# Patient Record
Sex: Male | Born: 1955 | Race: Black or African American | Hispanic: No | Smoking: Never smoker
Health system: Southern US, Community
[De-identification: ages and names within clinical notes are randomized; demographics above are authoritative.]

## PROBLEM LIST (undated history)

## (undated) DIAGNOSIS — J439 Emphysema, unspecified: Secondary | ICD-10-CM

## (undated) HISTORY — PX: BACK SURGERY: SHX140

## (undated) HISTORY — PX: FOOT SURGERY: SHX648

---

## 2016-08-15 ENCOUNTER — Encounter (HOSPITAL_COMMUNITY): Payer: Self-pay | Admitting: *Deleted

## 2016-08-15 ENCOUNTER — Emergency Department (HOSPITAL_COMMUNITY)
Admission: EM | Admit: 2016-08-15 | Discharge: 2016-08-15 | Disposition: A | Discharge status: Home / Self Care | Payer: Self-pay | Attending: Emergency Medicine | Admitting: Emergency Medicine

## 2016-08-15 DIAGNOSIS — Y929 Unspecified place or not applicable: Secondary | ICD-10-CM | POA: Insufficient documentation

## 2016-08-15 DIAGNOSIS — S60562A Insect bite (nonvenomous) of left hand, initial encounter: Secondary | ICD-10-CM | POA: Insufficient documentation

## 2016-08-15 DIAGNOSIS — Y939 Activity, unspecified: Secondary | ICD-10-CM | POA: Insufficient documentation

## 2016-08-15 DIAGNOSIS — S60561A Insect bite (nonvenomous) of right hand, initial encounter: Secondary | ICD-10-CM | POA: Insufficient documentation

## 2016-08-15 DIAGNOSIS — W57XXXA Bitten or stung by nonvenomous insect and other nonvenomous arthropods, initial encounter: Secondary | ICD-10-CM | POA: Insufficient documentation

## 2016-08-15 DIAGNOSIS — Y999 Unspecified external cause status: Secondary | ICD-10-CM | POA: Insufficient documentation

## 2016-08-15 HISTORY — DX: Emphysema, unspecified: J43.9

## 2016-08-15 MED ORDER — HYDROXYZINE HCL 25 MG PO TABS: 25.0000 mg | ORAL_TABLET | Freq: Three times a day (TID) | ORAL | 0 refills | Status: AC | PRN

## 2016-08-15 MED ORDER — PERMETHRIN 5 % EX CREA: TOPICAL_CREAM | CUTANEOUS | 0 refills | Status: AC

## 2016-08-15 NOTE — Discharge Instructions (Signed)
It was my pleasure taking care of you today!  Atarax as needed for itching. Apply Permethrin as directed. Be sure the cream is on the skin for at least 8 hours.  Most people sleep in it over night & wash it off in the morning. You may repeat treatment in 1 week if there is no improvement. You may also use camomile lotion if needed - try not to scratch! Please follow-up with your primary doctor in 3-5 days for discussion of your diagnoses and further evaluation after today's visit; Please return to the ER for new or worsening symptoms, signs of infection or any additional concerns.   Wash all clothing & linens in hot water. Spray all upholstered surfaces with Nix spray.  Treat all family members with the prescribed cream.

## 2016-08-15 NOTE — ED Triage Notes (Signed)
Patient is alert and oriented x4.  He is being seen this morning for multiple bug bites all over his body.  Currently he describes his pain as 5 of 10 discomfort.

## 2016-08-15 NOTE — ED Provider Notes (Signed)
[facility]-EMERGENCY DEPT Provider Note   CSN: [id number] Arrival date & time: 08/15/16  0411     History   Chief Complaint Chief Complaint  Patient presents with  . Insect Bite    HPI Kenneth [facility] is a 61 y.o. male.  The history is provided by the patient and medical records. No language interpreter was used.   Kenneth [facility] is a 61 y.o. male who presents to the Emergency Department complaining of generalized itching and tiny bug bites all over entire body x 3-4 days. Roommate with similar bites. [facility] type of topical cream to put all over but he doesn't know name of cream. No other meds taken. Itching worse at night. No alleviating factors noted. Has put all of his sheets and clothes in plastic bags. He was told maintaince guy at his housing complex would be coming by to exterminate but has not done so yet.   Past Medical History:  Diagnosis Date  . Emphysema lung (HCC)     There are no active problems to display for this patient.   Past Surgical History:  Procedure Laterality Date  . BACK SURGERY    . FOOT SURGERY     left foot       Home Medications    Prior to Admission medications   Not on File    Family History History reviewed. No pertinent family history.  Social History Social History  Substance Use Topics  . Smoking status: Never Smoker  . Smokeless tobacco: Never Used  . Alcohol use No     Allergies   Patient has no known allergies.   Review of Systems Review of Systems  Constitutional: Negative for chills and fever.  Musculoskeletal: Negative for arthralgias and myalgias.  Skin: Positive for rash.     Physical Exam Updated Vital Signs BP 124/88 (BP Location: Left Arm)   Pulse 70   Temp 97.9 F (36.6 C) (Oral)   Resp 18   Ht 6' (1.829 m)   Wt 68 kg (150 lb)   SpO2 100%   BMI 20.34 kg/m   Physical Exam  Constitutional: He is oriented to person, place, and time. He appears well-developed and  well-nourished. No distress.  HENT:  Head: Normocephalic and atraumatic.  Cardiovascular: Normal rate, regular rhythm and normal heart sounds.   No murmur heard. Pulmonary/Chest: Effort normal and breath sounds normal. No respiratory distress.  Abdominal: Soft. He exhibits no distension. There is no tenderness.  Musculoskeletal: Normal range of motion.  Neurological: He is alert and oriented to person, place, and time.  Skin: Skin is warm and dry.  No visible rash seen. Excoriation marks to upper extremities.  Nursing note and vitals reviewed.    ED Treatments / Results  Labs (all labs ordered are listed, but only abnormal results are displayed) Labs Reviewed - No data to display  EKG  EKG Interpretation None       Radiology No results found.  Procedures Procedures (including critical care time)  Medications Ordered in ED Medications - No data to display   Initial Impression / Assessment and Plan / ED Course  I have reviewed the triage vital signs and the nursing notes.  Pertinent labs & imaging results that were available during my care of the patient were reviewed by me and considered in my medical decision making (see chart for details).    [name] is a 61 y.o. male who presents to ED for pruritic rash c/w  bed bugs. Roommate with similar. No fevers or other systemic symptoms. No signs of superimposed infection. Advised on proper cleaning of household, clothing and bedding for treatment of infestation. Encouraged to call exterminator. Symptomatic home care discussed. All questions answered.   Final Clinical Impressions(s) / ED Diagnoses   Final diagnoses:  None    New Prescriptions New Prescriptions   No medications on file     Ward, Chase PicketJaime Pilcher, PA-C 08/15/16 [id number]    Palumbo, April, MD 08/15/16 [id number]

## 2018-06-19 ENCOUNTER — Emergency Department (HOSPITAL_COMMUNITY)
Admission: EM | Admit: 2018-06-19 | Discharge: 2018-06-19 | Disposition: A | Discharge status: Other Acute Inpatient Hospital | Payer: No Typology Code available for payment source | Attending: Emergency Medicine | Admitting: Emergency Medicine

## 2018-06-19 ENCOUNTER — Other Ambulatory Visit: Payer: Self-pay

## 2018-06-19 ENCOUNTER — Encounter (HOSPITAL_COMMUNITY): Payer: Self-pay

## 2018-06-19 ENCOUNTER — Emergency Department (HOSPITAL_COMMUNITY): Payer: No Typology Code available for payment source

## 2018-06-19 DIAGNOSIS — T85590A Other mechanical complication of bile duct prosthesis, initial encounter: Secondary | ICD-10-CM | POA: Diagnosis not present

## 2018-06-19 DIAGNOSIS — Y732 Prosthetic and other implants, materials and accessory gastroenterology and urology devices associated with adverse incidents: Secondary | ICD-10-CM | POA: Diagnosis not present

## 2018-06-19 DIAGNOSIS — K859 Acute pancreatitis without necrosis or infection, unspecified: Secondary | ICD-10-CM | POA: Insufficient documentation

## 2018-06-19 DIAGNOSIS — K9181 Other intraoperative complications of digestive system: Secondary | ICD-10-CM

## 2018-06-19 DIAGNOSIS — R1084 Generalized abdominal pain: Secondary | ICD-10-CM | POA: Diagnosis present

## 2018-06-19 DIAGNOSIS — K8689 Other specified diseases of pancreas: Secondary | ICD-10-CM | POA: Insufficient documentation

## 2018-06-19 DIAGNOSIS — J449 Chronic obstructive pulmonary disease, unspecified: Secondary | ICD-10-CM | POA: Insufficient documentation

## 2018-06-19 LAB — CBC WITH DIFFERENTIAL/PLATELET
Abs Immature Granulocytes: 0.07 10*3/uL (ref 0.00–0.07)
BASOS ABS: 0 10*3/uL (ref 0.0–0.1)
Basophils Relative: 0 %
EOS PCT: 0 %
Eosinophils Absolute: 0 10*3/uL (ref 0.0–0.5)
HCT: 43.8 % (ref 39.0–52.0)
Hemoglobin: 14.9 g/dL (ref 13.0–17.0)
Immature Granulocytes: 1 %
LYMPHS PCT: 10 %
Lymphs Abs: 1.3 10*3/uL (ref 0.7–4.0)
MCH: 33.9 pg (ref 26.0–34.0)
MCHC: 34 g/dL (ref 30.0–36.0)
MCV: 99.8 fL (ref 80.0–100.0)
MONO ABS: 0.8 10*3/uL (ref 0.1–1.0)
Monocytes Relative: 5 %
NRBC: 0 % (ref 0.0–0.2)
Neutro Abs: 11.9 10*3/uL — ABNORMAL HIGH (ref 1.7–7.7)
Neutrophils Relative %: 84 %
Platelets: 299 10*3/uL (ref 150–400)
RBC: 4.39 MIL/uL (ref 4.22–5.81)
RDW: 13.7 % (ref 11.5–15.5)
WBC: 14.1 10*3/uL — ABNORMAL HIGH (ref 4.0–10.5)

## 2018-06-19 LAB — COMPREHENSIVE METABOLIC PANEL
ALT: 17 U/L (ref 0–44)
ANION GAP: 9 (ref 5–15)
AST: 24 U/L (ref 15–41)
Albumin: 3.7 g/dL (ref 3.5–5.0)
Alkaline Phosphatase: 57 U/L (ref 38–126)
BILIRUBIN TOTAL: 1.1 mg/dL (ref 0.3–1.2)
BUN: 14 mg/dL (ref 8–23)
CALCIUM: 8.5 mg/dL — AB (ref 8.9–10.3)
CHLORIDE: 104 mmol/L (ref 98–111)
CO2: 24 mmol/L (ref 22–32)
Creatinine, Ser: 0.92 mg/dL (ref 0.61–1.24)
GFR calc non Af Amer: >60 mL/min (ref >60)
Glucose, Bld: 134 mg/dL — ABNORMAL HIGH (ref 70–99)
POTASSIUM: 3.8 mmol/L (ref 3.5–5.1)
Sodium: 137 mmol/L (ref 135–145)
TOTAL PROTEIN: 7.2 g/dL (ref 6.5–8.1)

## 2018-06-19 LAB — LIPASE, BLOOD: Lipase: 1081 U/L — ABNORMAL HIGH (ref 11–51)

## 2018-06-19 LAB — URINALYSIS, ROUTINE W REFLEX MICROSCOPIC
BILIRUBIN URINE: NEGATIVE
Glucose, UA: NEGATIVE mg/dL
Ketones, ur: NEGATIVE mg/dL
Leukocytes,Ua: NEGATIVE
NITRITE: NEGATIVE
Protein, ur: NEGATIVE mg/dL
Specific Gravity, Urine: 1.016 (ref 1.005–1.030)
pH: 5 (ref 5.0–8.0)

## 2018-06-19 LAB — ETHANOL: Alcohol, Ethyl (B): <10 mg/dL (ref <10)

## 2018-06-19 MED ORDER — HYDROMORPHONE HCL 1 MG/ML IJ SOLN
1.0000 mg | Freq: Once | INTRAMUSCULAR | Status: AC
  Administered 2018-06-19: 1 mg via INTRAVENOUS
  Filled 2018-06-19: qty 1

## 2018-06-19 MED ORDER — PIPERACILLIN-TAZOBACTAM 3.375 G IVPB 30 MIN
3.3750 g | Freq: Once | INTRAVENOUS | Status: AC
  Administered 2018-06-19: 3.375 g via INTRAVENOUS
  Filled 2018-06-19: qty 50

## 2018-06-19 MED ORDER — IOHEXOL 300 MG/ML  SOLN
100.0000 mL | Freq: Once | INTRAMUSCULAR | Status: AC | PRN
  Administered 2018-06-19: 100 mL via INTRAVENOUS

## 2018-06-19 MED ORDER — SODIUM CHLORIDE (PF) 0.9 % IJ SOLN
INTRAMUSCULAR | Status: AC
  Filled 2018-06-19: qty 50

## 2018-06-19 MED ORDER — MORPHINE SULFATE (PF) 4 MG/ML IV SOLN
4.0000 mg | Freq: Once | INTRAVENOUS | Status: AC
  Administered 2018-06-19: 4 mg via INTRAVENOUS
  Filled 2018-06-19: qty 1

## 2018-06-19 MED ORDER — ONDANSETRON HCL 4 MG/2ML IJ SOLN
4.0000 mg | Freq: Once | INTRAMUSCULAR | Status: AC
  Administered 2018-06-19: 4 mg via INTRAVENOUS
  Filled 2018-06-19: qty 2

## 2018-06-19 MED ORDER — ONDANSETRON HCL 4 MG/2ML IJ SOLN
INTRAMUSCULAR | Status: AC
  Administered 2018-06-19: 4 mg
  Filled 2018-06-19: qty 2

## 2018-06-19 MED ORDER — SODIUM CHLORIDE 0.9 % IV BOLUS
1000.0000 mL | Freq: Once | INTRAVENOUS | Status: AC
  Administered 2018-06-19: 1000 mL via INTRAVENOUS

## 2018-06-19 MED ORDER — MORPHINE SULFATE (PF) 2 MG/ML IV SOLN
2.0000 mg | Freq: Once | INTRAVENOUS | Status: AC
  Administered 2018-06-19: 2 mg via INTRAVENOUS
  Filled 2018-06-19: qty 1

## 2018-06-19 NOTE — ED Notes (Signed)
Report given to [facility]  

## 2018-06-19 NOTE — ED Notes (Signed)
Pt asked for more pain meds. Provider notified and will follow up.

## 2018-06-19 NOTE — ED Notes (Signed)
Pt in room. Meds given per MAR. Awaiting CT and lab results.

## 2018-06-19 NOTE — ED Triage Notes (Signed)
Pt had gallstones removed today and now he's in pain,he states they didn't give him anything for pain before he left Pt states he has also been vomiting

## 2018-06-19 NOTE — ED Notes (Signed)
Pt resting on side of bed. Reports mild relief from pain. Meds given per MAR. Awaiting provider and CT results. NAD

## 2018-06-19 NOTE — ED Notes (Signed)
[facility] for Dr USAA. PT go ED to ED(DR Katrinka Blazing)

## 2018-06-19 NOTE — ED Notes (Signed)
Meds given Per MAR. Awaiting transport. NAD

## 2018-06-19 NOTE — ED Provider Notes (Signed)
[facility] Provider Note   CSN: [id number] Arrival date & time: 06/19/18  0002    History   Chief Complaint Chief Complaint  Patient presents with   Abdominal Pain    HPI Torben Krawiec is a 63 y.o. male with PMHx COPD who presents to the ED complaining of sudden onset, constant,10/10, diffuse abdominal pain x 4 hours with nausea and vomiting. He states he had gallstones removed today at [facility] and when he went home he began having diffuse abdominal pain. He states he was prescribed pain medication but did not picked it up because his ride wouldn't take him to the pharmacy. Denies fever, chills, diarrhea, constipation, hematemesis, hematochezia, chest pain, SOB, or any other associated symptoms.        Past Medical History:  Diagnosis Date   Emphysema lung (HCC)     There are no active problems to display for this patient.   Past Surgical History:  Procedure Laterality Date   BACK SURGERY     FOOT SURGERY     left foot        Home Medications    Prior to Admission medications   Medication Sig Start Date End Date Taking? Authorizing Provider  albuterol (PROVENTIL HFA;VENTOLIN HFA) 108 (90 Base) MCG/ACT inhaler Inhale 1-2 puffs into the lungs every 6 (six) hours as needed for wheezing or shortness of breath.   Yes [provider]  hydrOXYzine (ATARAX/VISTARIL) 25 MG tablet Take 1 tablet (25 mg total) by mouth every 8 (eight) hours as needed for itching. Patient not taking: Reported on 06/19/2018 08/15/16   Ward, Chase Picket, PA-C  permethrin (ELIMITE) 5 % cream Apply to affected area once Patient not taking: Reported on 06/19/2018 08/15/16   Ward, Chase Picket, PA-C    Family History History reviewed. No pertinent family history.  Social History Social History   Tobacco Use   Smoking status: Never Smoker   Smokeless tobacco: Never Used  Substance Use Topics   Alcohol use: No   Drug use: No     Allergies     Patient has no known allergies.   Review of Systems Review of Systems  Constitutional: Negative for chills and fever.  HENT: Negative for ear pain and sore throat.   Eyes: Negative for visual disturbance.  Respiratory: Negative for cough and shortness of breath.   Cardiovascular: Negative for chest pain.  Gastrointestinal: Positive for abdominal pain, nausea and vomiting. Negative for blood in stool, constipation and diarrhea.  Genitourinary: Negative for discharge, dysuria, frequency and penile pain.  Musculoskeletal: Negative for back pain.  Skin: Negative for wound.  Neurological: Negative for headaches.     Physical Exam Updated Vital Signs BP 114/63    Pulse 77    Temp 98 F (36.7 C) (Oral)    Resp (!) 22    Ht 6' (1.829 m)    Wt 63.5 kg    SpO2 100%    BMI 18.99 kg/m   Physical Exam Vitals signs and nursing note reviewed.  Constitutional:      Appearance: He is not ill-appearing.  HENT:     Head: Normocephalic and atraumatic.     Mouth/Throat:     Mouth: Mucous membranes are moist.  Eyes:     General: No scleral icterus.    Conjunctiva/sclera: Conjunctivae normal.  Neck:     Musculoskeletal: Neck supple.  Cardiovascular:     Rate and Rhythm: Normal rate and regular rhythm.  Pulmonary:  Effort: Pulmonary effort is normal.     Breath sounds: Normal breath sounds. No wheezing, rhonchi or rales.  Abdominal:     General: Abdomen is flat. Bowel sounds are normal.     Palpations: Abdomen is rigid.     Tenderness: There is generalized abdominal tenderness. There is guarding (voluntary). There is no right CVA tenderness, left CVA tenderness or rebound.     Hernia: No hernia is present.     Comments: No surgical sites appreciated to abdomen  Skin:    General: Skin is warm and dry.  Neurological:     Mental Status: He is alert.      ED Treatments / Results  Labs (all labs ordered are listed, but only abnormal results are displayed) Labs Reviewed   COMPREHENSIVE METABOLIC PANEL - Abnormal; Notable for the following components:      Result Value   Glucose, Bld 134 (*)    Calcium 8.5 (*)    All other components within normal limits  LIPASE, BLOOD - Abnormal; Notable for the following components:   Lipase 1,081 (*)    All other components within normal limits  CBC WITH DIFFERENTIAL/PLATELET - Abnormal; Notable for the following components:   WBC 14.1 (*)    Neutro Abs 11.9 (*)    All other components within normal limits  URINALYSIS, ROUTINE W REFLEX MICROSCOPIC - Abnormal; Notable for the following components:   Hgb urine dipstick SMALL (*)    Bacteria, UA RARE (*)    All other components within normal limits  ETHANOL    EKG None  Radiology Ct Abdomen Pelvis W Contrast  Result Date: 06/19/2018 CLINICAL DATA:  63 year old male with acute abdominal pain. Recent gallstone removal via ERCP. EXAM: CT ABDOMEN AND PELVIS WITH CONTRAST TECHNIQUE: Multidetector CT imaging of the abdomen and pelvis was performed using the standard protocol following bolus administration of intravenous contrast. CONTRAST:  100mL OMNIPAQUE IOHEXOL 300 MG/ML  SOLN COMPARISON:  None. FINDINGS: Lower chest: The visualized lung bases are clear. There is moderate amount of free fluid in the upper abdomen with small scattered pockets of extraluminal air. Hepatobiliary: The liver is unremarkable. Contrast is noted within the gallbladder related to recent ERCP. There is pneumobilia. A biliary stent is noted. The stent however appears to be migrated slightly superiorly. The hepatic end of the stent appears to have perforated through the biliary tree and is outside of the CBD and located anterior to the common hepatic duct. Pancreas: Mild edema of the uncinate process of the pancreas, likely reactive. Spleen: Normal in size without focal abnormality. Adrenals/Urinary Tract: Adrenal glands are unremarkable. Kidneys are normal, without renal calculi, focal lesion, or  hydronephrosis. Bladder is unremarkable. Stomach/Bowel: There is no bowel obstruction or active inflammation. Normal appendix. Vascular/Lymphatic: Mild aortoiliac atherosclerotic disease. The IVC is unremarkable. No portal venous gas. There is no adenopathy. Reproductive: The prostate and seminal vesicles are grossly unremarkable. Other: None Musculoskeletal: Lower lumbar laminectomy and degenerative changes. No acute osseous pathology. IMPRESSION: Perforation of the extrahepatic biliary tree with extraluminal positioning of the hepatic end of the biliary stent outside of the CBD and anterior to the common hepatic duct. There is moderate amount of free fluid in the upper abdomen and small pockets of extraluminal air. These results were called by telephone at the time of interpretation on 06/19/2018 at 3:18 am to Dr. Tanda RockersMARGAUX Jolyne Laye , who verbally acknowledged these results. Electronically Signed   By: Elgie CollardArash  Radparvar M.D.   On: 06/19/2018 03:41  Procedures .Critical Care Performed by: Tanda Rockers, PA-C Authorized by: Tanda Rockers, PA-C   Critical care provider statement:    Critical care time (minutes):  45   Critical care was necessary to treat or prevent imminent or life-threatening deterioration of the following conditions: Perforated biliary duct s/p ERCP.   Critical care was time spent personally by me on the following activities:  Discussions with consultants, evaluation of patient's response to treatment, examination of patient, ordering and performing treatments and interventions, ordering and review of laboratory studies, ordering and review of radiographic studies, pulse oximetry, re-evaluation of patient's condition, obtaining history from patient or surrogate and review of old charts   (including critical care time)  Medications Ordered in ED Medications  sodium chloride (PF) 0.9 % injection (has no administration in time range)  sodium chloride 0.9 % bolus 1,000 mL (0 mLs  Intravenous Stopped 06/19/18 0224)  morphine 2 MG/ML injection 2 mg (2 mg Intravenous Given 06/19/18 0112)  ondansetron (ZOFRAN) 4 MG/2ML injection (4 mg  Given 06/19/18 0112)  morphine 4 MG/ML injection 4 mg (4 mg Intravenous Given 06/19/18 0223)  iohexol (OMNIPAQUE) 300 MG/ML solution 100 mL (100 mLs Intravenous Contrast Given 06/19/18 0246)  ondansetron (ZOFRAN) injection 4 mg (4 mg Intravenous Given 06/19/18 0317)  HYDROmorphone (DILAUDID) injection 1 mg (1 mg Intravenous Given 06/19/18 0334)  piperacillin-tazobactam (ZOSYN) IVPB 3.375 g (0 g Intravenous Stopped 06/19/18 0440)  HYDROmorphone (DILAUDID) injection 1 mg (1 mg Intravenous Given 06/19/18 0401)     Initial Impression / Assessment and Plan / ED Course  I have reviewed the triage vital signs and the nursing notes.  Pertinent labs & imaging results that were available during my care of the patient were reviewed by me and considered in my medical decision making (see chart for details).  Pt presents with diffuse abdominal pain, nausea, vomiting. Pt is poor historian. Reports he had gallstones removed today. Do not appreciate any surgical sites to abdomen. Pt then states he had kidney stones removed. When prompted further he reports they went down his throat with a tube and up his rectum. Possible that pt had gallstones removed endoscopically. No Care Everywhere tab for patient; no other notes from [facility] in system. Unsure what procedure patient got done today. As he is actively vomiting in the room and has TTP to abdomen will get baseline labs including CBC, CMP, lipase, EtOH, U/A and CT A/P. 1 L NS bolus, Zofran, and 2 mg Morphine ordered for symptom control. Will reevaluate once labs return.   2:12 AM Nursing staff informed that pt's chart now shows [facility] notes - showed ERCP done yesterday 03/30. Unable to see full notes but can see procedure was done. Pt still uncomfortable on reeval; will order 4 mg Morphine prior to CT A/P. Mild leukocytosis  at 14.1; afebrile in the ED; likely from vomiting. CMP unremarkable; no increase in LFTs. U/A negative as well as EtOH. Still awaiting lipase; suspect pancreatitis s/p ERCP at this point.   Lipase elevated > 1,000; confirms pancreatitis s/p ERCP. Awaiting CT A/P prior to calling [facility] for transfer and admission.   3:30 AM Dr. Read Drivers attending physician received call from radiologist regarding CT A/P; biliary duct perforation from stent placement. Will call EDP at [facility] for direct transfer. Started IV Zosyn.   3:40 AM Dr. Read Drivers discussed case with EDP Dr. Katrinka Blazing at [facility] who will accept patient. [facility] to transfer patient.        Final Clinical Impressions(s) / ED Diagnoses  Final diagnoses:  Pancreatitis due to obstruction of pancreatic duct  Injury of common bile duct during operative procedure  Common bile duct obstruction secondary to biliary stent    ED Discharge Orders    None       Tanda Rockers, PA-C 06/19/18 0454    Molpus, Jonny Ruiz, MD 06/19/18 [phone]

## 2018-06-19 NOTE — ED Notes (Signed)
[facility] dispatch notified for need of transport.  

## 2018-06-19 NOTE — ED Notes (Signed)
Patient transported to CT 

## 2018-06-19 NOTE — ED Notes (Signed)
Pt resting in bed. IV started, meds given and blood drawn. Pt awaiting CT.

## 2018-07-12 ENCOUNTER — Other Ambulatory Visit: Payer: Self-pay

## 2018-07-12 ENCOUNTER — Emergency Department (HOSPITAL_COMMUNITY)
Admission: EM | Admit: 2018-07-12 | Discharge: 2018-07-12 | Disposition: A | Discharge status: Other Acute Inpatient Hospital | Payer: Self-pay | Attending: Emergency Medicine | Admitting: Emergency Medicine

## 2018-07-12 ENCOUNTER — Encounter (HOSPITAL_COMMUNITY): Payer: Self-pay | Admitting: Emergency Medicine

## 2018-07-12 ENCOUNTER — Emergency Department (HOSPITAL_COMMUNITY): Payer: Self-pay

## 2018-07-12 DIAGNOSIS — R1011 Right upper quadrant pain: Secondary | ICD-10-CM | POA: Insufficient documentation

## 2018-07-12 DIAGNOSIS — Z79899 Other long term (current) drug therapy: Secondary | ICD-10-CM | POA: Insufficient documentation

## 2018-07-12 LAB — CBC WITH DIFFERENTIAL/PLATELET
Abs Immature Granulocytes: 0.01 10*3/uL (ref 0.00–0.07)
Basophils Absolute: 0 10*3/uL (ref 0.0–0.1)
Basophils Relative: 1 %
Eosinophils Absolute: 0 10*3/uL (ref 0.0–0.5)
Eosinophils Relative: 1 %
HCT: 41.2 % (ref 39.0–52.0)
Hemoglobin: 14.3 g/dL (ref 13.0–17.0)
Immature Granulocytes: 0 %
Lymphocytes Relative: 39 %
Lymphs Abs: 1.7 10*3/uL (ref 0.7–4.0)
MCH: 34.9 pg — ABNORMAL HIGH (ref 26.0–34.0)
MCHC: 34.7 g/dL (ref 30.0–36.0)
MCV: 100.5 fL — ABNORMAL HIGH (ref 80.0–100.0)
Monocytes Absolute: 0.5 10*3/uL (ref 0.1–1.0)
Monocytes Relative: 11 %
Neutro Abs: 2.1 10*3/uL (ref 1.7–7.7)
Neutrophils Relative %: 48 %
Platelets: 337 10*3/uL (ref 150–400)
RBC: 4.1 MIL/uL — ABNORMAL LOW (ref 4.22–5.81)
RDW: 14.8 % (ref 11.5–15.5)
WBC: 4.3 10*3/uL (ref 4.0–10.5)
nRBC: 0 % (ref 0.0–0.2)

## 2018-07-12 LAB — COMPREHENSIVE METABOLIC PANEL
ALT: 31 U/L (ref 0–44)
AST: 22 U/L (ref 15–41)
Albumin: 4 g/dL (ref 3.5–5.0)
Alkaline Phosphatase: 73 U/L (ref 38–126)
Anion gap: 8 (ref 5–15)
BUN: 13 mg/dL (ref 8–23)
CO2: 28 mmol/L (ref 22–32)
Calcium: 9.4 mg/dL (ref 8.9–10.3)
Chloride: 101 mmol/L (ref 98–111)
Creatinine, Ser: 0.85 mg/dL (ref 0.61–1.24)
GFR calc Af Amer: >60 mL/min (ref >60)
GFR calc non Af Amer: >60 mL/min (ref >60)
Glucose, Bld: 83 mg/dL (ref 70–99)
Potassium: 4.1 mmol/L (ref 3.5–5.1)
Sodium: 137 mmol/L (ref 135–145)
Total Bilirubin: 0.9 mg/dL (ref 0.3–1.2)
Total Protein: 8.2 g/dL — ABNORMAL HIGH (ref 6.5–8.1)

## 2018-07-12 LAB — LIPASE, BLOOD: Lipase: 23 U/L (ref 11–51)

## 2018-07-12 MED ORDER — IOHEXOL 300 MG/ML  SOLN
100.0000 mL | Freq: Once | INTRAMUSCULAR | Status: AC | PRN
  Administered 2018-07-12: 100 mL via INTRAVENOUS

## 2018-07-12 MED ORDER — ONDANSETRON HCL 4 MG/2ML IJ SOLN
4.0000 mg | Freq: Once | INTRAMUSCULAR | Status: AC
Start: 2018-07-12 — End: 2018-07-12
  Administered 2018-07-12: 4 mg via INTRAVENOUS
  Filled 2018-07-12: qty 2

## 2018-07-12 MED ORDER — FENTANYL CITRATE (PF) 100 MCG/2ML IJ SOLN
50.0000 ug | Freq: Once | INTRAMUSCULAR | Status: AC
  Administered 2018-07-12: 50 ug via INTRAVENOUS
  Filled 2018-07-12: qty 2

## 2018-07-12 MED ORDER — SODIUM CHLORIDE 0.9 % IV BOLUS
500.0000 mL | Freq: Once | INTRAVENOUS | Status: AC
Start: 2018-07-12 — End: 2018-07-12
  Administered 2018-07-12: 500 mL via INTRAVENOUS

## 2018-07-12 NOTE — ED Triage Notes (Addendum)
Patient c/o RUQ pain since d/c after surgery with stent placement. Reports N/V. Reports complications with surgery.

## 2018-07-12 NOTE — ED Notes (Signed)
EDP at bedside  

## 2018-07-12 NOTE — ED Notes (Addendum)
[facility] arrived for transport 

## 2018-07-12 NOTE — ED Provider Notes (Signed)
[facility]-EMERGENCY DEPT Provider Note   CSN: [id number] Arrival date & time: 07/12/18  1452    History   Chief Complaint Chief Complaint  Patient presents with  . Abdominal Pain    HPI [facility] is a 63 y.o. male.     HPI Patient presents with concern of right upper quadrant abdominal pain, nausea, vomiting, anorexia Patient has a notable history of recent hepatobiliary stent placement with complication of perforation. The procedure was [facility] at [facility], complication identified in 1 of our facilities 1 month ago. He notes that since the procedure itself he has had ongoing pain in the area. After his evaluation here, he was transferred to [facility], had evaluation, has not yet seen his surgeon again. He presents today due to worsening pain, sharp, severe in the right upper quadrant, nonradiating. There is associated generalized weakness, anorexia, and he has been vomiting, as recently as earlier today. No change in bowel movements. No medication taken for relief, he notes that he has no medication at home. Past Medical History:  Diagnosis Date  . Emphysema lung (HCC)     There are no active problems to display for this patient.   Past Surgical History:  Procedure Laterality Date  . BACK SURGERY    . FOOT SURGERY     left foot        Home Medications    Prior to Admission medications   Medication Sig Start Date End Date Taking? Authorizing Provider  albuterol (PROVENTIL HFA;VENTOLIN HFA) 108 (90 Base) MCG/ACT inhaler Inhale 1-2 puffs into the lungs every 6 (six) hours as needed for wheezing or shortness of breath.    [provider]  hydrOXYzine (ATARAX/VISTARIL) 25 MG tablet Take 1 tablet (25 mg total) by mouth every 8 (eight) hours as needed for itching. Patient not taking: Reported on 06/19/2018 08/15/16   Ward, Chase PicketJaime Pilcher, PA-C  permethrin (ELIMITE) 5 % cream Apply to affected area once Patient not  taking: Reported on 06/19/2018 08/15/16   Ward, Chase PicketJaime Pilcher, PA-C    Family History No family history on file.  Social History Social History   Tobacco Use  . Smoking status: Never Smoker  . Smokeless tobacco: Never Used  Substance Use Topics  . Alcohol use: No  . Drug use: No     Allergies   Patient has no known allergies.   Review of Systems Review of Systems  Constitutional:       Per HPI, otherwise negative  HENT:       Per HPI, otherwise negative  Respiratory:       Per HPI, otherwise negative  Cardiovascular:       Per HPI, otherwise negative  Gastrointestinal: Positive for abdominal pain, nausea and vomiting.  Endocrine:       Negative aside from HPI  Genitourinary:       Neg aside from HPI   Musculoskeletal:       Per HPI, otherwise negative  Skin: Negative.   Neurological: Negative for syncope.     Physical Exam Updated Vital Signs BP 121/75 (BP Location: Left Arm)   Pulse 89   Temp 97.9 F (36.6 C) (Oral)   Resp 20   SpO2 100%   Physical Exam Vitals signs and nursing note reviewed.  Constitutional:      General: He is not in acute distress.    Appearance: He is well-developed.  HENT:     Head: Normocephalic and atraumatic.  Eyes:  Conjunctiva/sclera: Conjunctivae normal.  Cardiovascular:     Rate and Rhythm: Normal rate and regular rhythm.  Pulmonary:     Effort: Pulmonary effort is normal. No respiratory distress.     Breath sounds: No stridor.  Abdominal:     General: There is no distension.     Tenderness: There is abdominal tenderness in the right upper quadrant. There is guarding.  Skin:    General: Skin is warm and dry.  Neurological:     Mental Status: He is alert and oriented to person, place, and time.      ED Treatments / Results  Labs (all labs ordered are listed, but only abnormal results are displayed) Labs Reviewed  COMPREHENSIVE METABOLIC PANEL  LIPASE, BLOOD  CBC WITH DIFFERENTIAL/PLATELET    EKG None   Radiology No results found.  Procedures Procedures (including critical care time)  Medications Ordered in ED Medications  sodium chloride 0.9 % bolus 500 mL (has no administration in time range)  fentaNYL (SUBLIMAZE) injection 50 mcg (has no administration in time range)  ondansetron (ZOFRAN) injection 4 mg (has no administration in time range)     Initial Impression / Assessment and Plan / ED Course  I have reviewed the triage vital signs and the nursing notes.  Pertinent labs & imaging results that were available during my care of the patient were reviewed by me and considered in my medical decision making (see chart for details).    After the initial evaluation I reviewed the patient's chart including documentation from our healthcare facility, and [facility], with notation of possible complication from surgery, including perforation, migration of stent.  Below is a summary of presentation to [facility] after complication identified:  "Mr. Kutzer reports onset of abdominal pain, worst in epigastrium starting 4-5hrs after procedure. He says that he vomited 6 times overnight and has been unable to keep anything down since onset of pain. He presented to [facility] where workup was concerning for possible perforation of the CBD by the biliary stent. He was transferred back to [facility] ED for further evaluation/managment. A CT A/P w/ contrast was obtained, which showed findings concerning for perforation of the extrahepatic biliary tree with extraluminal positioning of the hepatic end of the biliary stent outside of the CBD and anterior to the common hepatic duct. There is moderate amount of free fluid in the upper abdomen and small pockets of extraluminal air."  On repeat exam the patient is awake and alert continues to complain of pain, has received several doses of medication thus far. I reviewed findings thus far include reassuring labs.  Chart review also notable for CT results  concerning for migration of stent, the day after his initial procedure. Repeat CT performed today with results as above, somewhat reassuring, though with concerning findings for stent migration, and with pneumobilia, ongoing pain, concern for biliary tree injury discussed this case with our surgeon, and her gastroenterologist locally. Consult suggests patient needs additional evaluation from surgical subspecialist. That service is not available locally, and the patient is unwilling to return to [facility], as he identifies it at the place where he sustained complication. Subsequently discussed the patient's case with colleagues at [facility], and Dr. Izola Price accepts the patient for transfer to the surgical team.  Patient's labs are reassuring, vitals reassuring, but with ongoing pain requiring multiple doses of analgesia, abnormal CT finding, there is concern for damage to the biliary tree, patient transferred for further evaluation, monitoring, management.  He is aware  of the transfer, agreeable to it.  Final Clinical Impressions(s) / ED Diagnoses   Final diagnoses:  Right upper quadrant abdominal pain      Gerhard Munch, MD 07/12/18 2018

## 2018-07-12 NOTE — ED Notes (Signed)
Report called to Weston Brass at [facility] and transport arranged.

## 2018-07-12 NOTE — ED Notes (Signed)
Pt called this nurse into room upset stating "I am not going back to [facility]. You need to go tell the doctor if they do not do it here tonight then I will get up and leave." This nurse explained that they normally have to get a call back from the consults which is why we do not have any answer right now. Pt continued to yell about not sitting here all night long that this nurse needs to go get an answer-now. Doctor Jeraldine Loots arrived in patients room and explained that we will transfer to [facility] and they will do the surgery either tonight or tomorrow. Pt agreed.

## 2018-07-12 NOTE — ED Notes (Signed)
Spoke to CT about getting a disc for [facility], states they will send it to them through [employer].

## 2018-10-12 ENCOUNTER — Emergency Department (HOSPITAL_COMMUNITY)
Admission: EM | Admit: 2018-10-12 | Discharge: 2018-10-12 | Disposition: A | Discharge status: Home / Self Care | Payer: No Typology Code available for payment source | Attending: Emergency Medicine | Admitting: Emergency Medicine

## 2018-10-12 ENCOUNTER — Emergency Department (HOSPITAL_COMMUNITY): Payer: No Typology Code available for payment source

## 2018-10-12 ENCOUNTER — Other Ambulatory Visit: Payer: Self-pay

## 2018-10-12 DIAGNOSIS — R112 Nausea with vomiting, unspecified: Secondary | ICD-10-CM

## 2018-10-12 DIAGNOSIS — R1011 Right upper quadrant pain: Secondary | ICD-10-CM | POA: Diagnosis not present

## 2018-10-12 DIAGNOSIS — R1310 Dysphagia, unspecified: Secondary | ICD-10-CM | POA: Diagnosis present

## 2018-10-12 LAB — CBC
HCT: 45 % (ref 39.0–52.0)
Hemoglobin: 16.5 g/dL (ref 13.0–17.0)
MCH: 34.8 pg — ABNORMAL HIGH (ref 26.0–34.0)
MCHC: 36.7 g/dL — ABNORMAL HIGH (ref 30.0–36.0)
MCV: 94.9 fL (ref 80.0–100.0)
Platelets: 332 10*3/uL (ref 150–400)
RBC: 4.74 MIL/uL (ref 4.22–5.81)
RDW: 14.2 % (ref 11.5–15.5)
WBC: 4.5 10*3/uL (ref 4.0–10.5)
nRBC: 0 % (ref 0.0–0.2)

## 2018-10-12 LAB — COMPREHENSIVE METABOLIC PANEL
ALT: 13 U/L (ref 0–44)
AST: 20 U/L (ref 15–41)
Albumin: 4 g/dL (ref 3.5–5.0)
Alkaline Phosphatase: 58 U/L (ref 38–126)
Anion gap: 14 (ref 5–15)
BUN: 9 mg/dL (ref 8–23)
CO2: 23 mmol/L (ref 22–32)
Calcium: 9.3 mg/dL (ref 8.9–10.3)
Chloride: 101 mmol/L (ref 98–111)
Creatinine, Ser: 1.11 mg/dL (ref 0.61–1.24)
GFR calc Af Amer: >60 mL/min (ref >60)
GFR calc non Af Amer: >60 mL/min (ref >60)
Glucose, Bld: 82 mg/dL (ref 70–99)
Potassium: 4.1 mmol/L (ref 3.5–5.1)
Sodium: 138 mmol/L (ref 135–145)
Total Bilirubin: 1.5 mg/dL — ABNORMAL HIGH (ref 0.3–1.2)
Total Protein: 7.5 g/dL (ref 6.5–8.1)

## 2018-10-12 LAB — URINALYSIS, ROUTINE W REFLEX MICROSCOPIC
Bilirubin Urine: NEGATIVE
Glucose, UA: NEGATIVE mg/dL
Hgb urine dipstick: NEGATIVE
Ketones, ur: 20 mg/dL — AB
Leukocytes,Ua: NEGATIVE
Nitrite: NEGATIVE
Protein, ur: 30 mg/dL — AB
Specific Gravity, Urine: 1.025 (ref 1.005–1.030)
pH: 5 (ref 5.0–8.0)

## 2018-10-12 LAB — LIPASE, BLOOD: Lipase: 18 U/L (ref 11–51)

## 2018-10-12 MED ORDER — LACTATED RINGERS IV BOLUS
1000.0000 mL | Freq: Once | INTRAVENOUS | Status: AC
  Administered 2018-10-12: 1000 mL via INTRAVENOUS

## 2018-10-12 MED ORDER — IOHEXOL 300 MG/ML  SOLN
100.0000 mL | Freq: Once | INTRAMUSCULAR | Status: AC | PRN
  Administered 2018-10-12: 20:00:00 100 mL via INTRAVENOUS

## 2018-10-12 MED ORDER — FENTANYL CITRATE (PF) 100 MCG/2ML IJ SOLN
50.0000 ug | INTRAMUSCULAR | Status: DC | PRN
  Administered 2018-10-12: 22:00:00 50 ug via INTRAVENOUS
  Filled 2018-10-12: qty 2

## 2018-10-12 MED ORDER — METOCLOPRAMIDE HCL 10 MG PO TABS: 10.0000 mg | ORAL_TABLET | Freq: Three times a day (TID) | ORAL | 0 refills | Status: AC

## 2018-10-12 NOTE — ED Provider Notes (Signed)
[facility]ENCY [facility] Provider Note   CSN: [id number] Arrival date & time: 10/12/18  1458     History   Chief Complaint Chief Complaint  Patient presents with  . Abdominal Pain    HPI Kenneth Norton is a 63 y.o. male.     Patient is complaining of 2 days of not being able to eat.  He states when he tries to swallow food it comes back up, although he does not necessarily describe it as vomiting.  More of difficulty swallowing.  He states he is still able to drink liquids and has been drinking water.  He is also complaining of abdominal pain.  This is mostly on the right side.  He is not complaining of diarrhea.  No fever, but is complaining of chills.  Patient states he had kidney stones 2 months ago and they "poked a hole" when doing surgery, and then [facility]  He said he has to go back for another surgery next week.  Patient states for the past 3 days Robynn Panelise he has been "dizzy all day".  Patient is not complaining of dysuria.  He denies cough except for when he is trying to swallow.  Patient states he has a history of emphysema for which he is uses inhaler, but does not know what is the name of it.  Also states he has "low blood count".  Patient lives by himself.  Upon chart review, it appears the patient had a hepatobiliary stent [facility] and there was concern for stent migration and perforation.  Patient did not [facility], and [facility].  Stent was removed using ERCP.  Patient had not had any complaints until he started having difficulty feeding 3 days ago.      Past Medical History:  Diagnosis Date  . Emphysema lung (HCC)     There are no active problems to display for this patient.   Past Surgical History:  Procedure Laterality Date  . BACK SURGERY    . FOOT SURGERY     left foot        Home Medications    Prior to Admission  medications   Medication Sig Start Date End Date Taking? Authorizing Provider  albuterol (PROVENTIL HFA;VENTOLIN HFA) 108 (90 Base) MCG/ACT inhaler Inhale 1-2 puffs into the lungs every 6 (six) hours as needed for wheezing or shortness of breath.    [provider]  hydrOXYzine (ATARAX/VISTARIL) 25 MG tablet Take 1 tablet (25 mg total) by mouth every 8 (eight) hours as needed for itching. Patient not taking: Reported on 06/19/2018 08/15/16   Ward, Chase PicketJaime Pilcher, PA-C  permethrin (ELIMITE) 5 % cream Apply to affected area once Patient not taking: Reported on 06/19/2018 08/15/16   Ward, Chase PicketJaime Pilcher, PA-C    Family History No family history on file.  Social History Social History   Tobacco Use  . Smoking status: Never Smoker  . Smokeless tobacco: Never Used  Substance Use Topics  . Alcohol use: No  . Drug use: No     Allergies   Patient has no known allergies.   Review of Systems Review of Systems  Constitutional: Positive for chills. Negative for diaphoresis and fever.  HENT: Positive for trouble swallowing. Negative for sore throat.   Respiratory: Positive for cough and choking. Negative for shortness of breath.   Cardiovascular: Negative for chest pain.  Gastrointestinal: Positive for  abdominal pain, nausea and vomiting. Negative for diarrhea.  Genitourinary: Negative for dysuria.  Musculoskeletal: Negative for myalgias.  Neurological: Negative for dizziness and headaches.     Physical Exam Updated Vital Signs BP (!) 136/92 (BP Location: Left Arm)   Pulse 96   Temp 98.5 F (36.9 C) (Oral)   Resp 16   SpO2 98%   Physical Exam Constitutional:      General: He is in acute distress.     Appearance: He is ill-appearing.  HENT:     Head: Normocephalic and atraumatic.     Mouth/Throat:     Mouth: Mucous membranes are moist.     Pharynx: Oropharynx is clear.  Cardiovascular:     Rate and Rhythm: Normal rate and regular rhythm.     Heart sounds: Normal heart  sounds. No murmur.  Pulmonary:     Effort: Pulmonary effort is normal. No respiratory distress.     Breath sounds: Normal breath sounds.  Abdominal:     General: Abdomen is flat. Bowel sounds are decreased. There is no distension.     Tenderness: There is abdominal tenderness in the right upper quadrant and right lower quadrant. There is right CVA tenderness and left CVA tenderness. Negative signs include Murphy's sign.     Hernia: No hernia is present.  Skin:    General: Skin is warm and dry.     Findings: No rash.  Neurological:     General: No focal deficit present.     Mental Status: He is alert.     Cranial Nerves: No cranial nerve deficit.  Psychiatric:        Mood and Affect: Mood normal.      ED Treatments / Results  Labs (all labs ordered are listed, but only abnormal results are displayed) Labs Reviewed  COMPREHENSIVE METABOLIC PANEL - Abnormal; Notable for the following components:      Result Value   Total Bilirubin 1.5 (*)    All other components within normal limits  CBC - Abnormal; Notable for the following components:   MCH 34.8 (*)    MCHC 36.7 (*)    All other components within normal limits  URINALYSIS, ROUTINE W REFLEX MICROSCOPIC - Abnormal; Notable for the following components:   APPearance HAZY (*)    Ketones, ur 20 (*)    Protein, ur 30 (*)    Bacteria, UA RARE (*)    All other components within normal limits  LIPASE, BLOOD    EKG None  Radiology No results found.  Procedures Procedures (including critical care time)  Medications Ordered in ED Medications  lactated ringers bolus 1,000 mL (has no administration in time range)     Initial Impression / Assessment and Plan / ED Course  I have reviewed the triage vital signs and the nursing notes.  Pertinent labs & imaging results that were available during my care of the patient were reviewed by me and considered in my medical decision making (see chart for details).        Patient  is a 63 year old male presenting with abdominal pain and inability to tolerate solid p.o. intake for 3 days.  Patient has a history of choledocholithiasis for which she had an ERCP on March 30.  Sphincterotomy was performed and a 7 cm 10 French Cotton long biliary stent was placed in the bile duct.  He developed pain 5 hours after this procedure and he was transferred to [facility] for concern of perforation of the stent.  Patient became frustrated during this admission and left AMA before outpatient antibiotics to be given.  On April 23 patient [facility] to [facility] with right upper quadrant pain patient was [facility] as he did not want to go back [facility].  At [facility].  He was discharged on 4/25 and told to follow-up with his primary care doctor.  Now with his worsening right upper quadrant pain there is concern for infection, or trauma.  CBC does not show leukocytosis, CMP does not show elevated LFTs.  Lipase is normal.  CT abdomen pelvis ordered.  Patient was giving 1 L bolus of lactated Ringer  Update: CT abdomen pelvis showed improving pneumobilia.  Did not show any acute abnormalities.  I contacted [facility] after patient claimed he had a appointment with them next week to discuss a surgical procedure to remove something, which he did not know specifically what.  They stated he did not have any appointments upcoming [facility].  I advised the patient to [facility] specifics.  The patient was given fentanyl for pain and Reglan for nausea.  Patient agreed to be discharged home with Reglan as needed with follow-up with his PCP.  Patient then later said that he cannot afford his [place] [facility] and [place] [facility] [place] not [place] Monday.  I informed patient that we cannot give him any medication here to go home with and that he would have to pick up his medication from the  pharmacy.  Patient became angry and started taking off his leads and trying to leave.  I was able to convince the patient to wait for the nurse to help come take his leads and IVs off.  Final Clinical Impressions(s) / ED Diagnoses   Final diagnoses:  None    ED Discharge Orders    None       Sandre Kittylson, Ozelle Brubacher K, MD 10/12/18 2359    Blane OharaZavitz, Joshua, MD 10/13/18 [phone]

## 2018-10-12 NOTE — Discharge Instructions (Addendum)
Your CT scan showed improvement from previous.  There is nothing on her CT scan to say why you are having stomach pain and difficulty keeping food down.  I have talked with [facility] gastroenterologist and they do not have any appointment scheduled for you in the future.  I would talk to your primary care provider and ask him specifically where you are going to have this procedure done and when.  I have also provided you with the information of a gastroenterologist in [place], who you can contact directly to schedule appointment.  I have given you a prescription for medication that should help with your nausea.  Please take this 3 times a day at most preferably before meals so that can help you keep the food down.  When eating food you should try to eat nothing but soft foods such as boiled vegetables, soft fruits, smoothies.  Try to drink as much nutritional supplements as possible such as Glucerna or boost plus.  Try to stay hydrated with drink plenty of water.  Reasons to come back to the emergency department would be if you cannot tolerate liquids or solids, you start to get a fever with your abdominal pain as this could be a sign of infection in your biliary tract.

## 2018-10-12 NOTE — ED Triage Notes (Signed)
Pt endorses abd pain and difficulty keeping food/fluid down x 2 days. Has hx of 2 abd surgeries. VSS

## 2019-07-25 ENCOUNTER — Emergency Department (HOSPITAL_COMMUNITY)
Admission: EM | Admit: 2019-07-25 | Discharge: 2019-07-25 | Disposition: A | Discharge status: Home / Self Care | Payer: No Typology Code available for payment source | Attending: Emergency Medicine | Admitting: Emergency Medicine

## 2019-07-25 ENCOUNTER — Encounter (HOSPITAL_COMMUNITY): Payer: Self-pay | Admitting: Emergency Medicine

## 2019-07-25 ENCOUNTER — Other Ambulatory Visit: Payer: Self-pay

## 2019-07-25 DIAGNOSIS — Z79899 Other long term (current) drug therapy: Secondary | ICD-10-CM | POA: Diagnosis not present

## 2019-07-25 DIAGNOSIS — G8929 Other chronic pain: Secondary | ICD-10-CM | POA: Insufficient documentation

## 2019-07-25 DIAGNOSIS — R42 Dizziness and giddiness: Secondary | ICD-10-CM | POA: Insufficient documentation

## 2019-07-25 DIAGNOSIS — M542 Cervicalgia: Secondary | ICD-10-CM | POA: Diagnosis not present

## 2019-07-25 MED ORDER — OXYCODONE-ACETAMINOPHEN 5-325 MG PO TABS
1.0000 | ORAL_TABLET | Freq: Once | ORAL | Status: AC
  Administered 2019-07-25: 1 via ORAL
  Filled 2019-07-25: qty 1

## 2019-07-25 MED ORDER — HYDROCODONE-ACETAMINOPHEN 5-325 MG PO TABS: 2.0000 | ORAL_TABLET | ORAL | 0 refills | Status: AC | PRN

## 2019-07-25 NOTE — ED Triage Notes (Addendum)
Patient BIBA from home, states he had a concussion in the 12s after serving in the [employer]. Complaining of headache and dizziness over last few months. States he had an [facility] last Monday. [facility] to be evaluated. Denies n/v. Negative for orthostatic changes.   116/70 P 90 RR16 98% RA  97.1 Temp

## 2019-07-25 NOTE — Discharge Instructions (Addendum)
Follow-up at the [facility] clinic

## 2019-07-25 NOTE — ED Provider Notes (Signed)
[place] DEPT Provider Note   CSN: [id number] Arrival date & time: 07/25/19  1001     History Chief Complaint  Patient presents with  . Migraine  . Dizziness    Kenneth Norton is a 64 y.o. male.  64 year old male with history of chronic neck pain presents with worsening burning in his neck and subjective swelling with radiation to both arms.  States that he chronically has right-sided arm weakness and is unchanged.  Was seen at the [facility] clinic and had MRI according to him on Monday but has not received the results.  Denies any trouble walking.  No severe headaches or trouble swallowing.  Is not take any medications for this at this time.  Pain is sharp and burning and worse with movement of his head.  States he does have a history of prior neck surgery 2 years ago and has had pain since then but feels that is worse over the last several weeks.        Past Medical History:  Diagnosis Date  . Emphysema lung ([place])     There are no problems to display for this patient.   Past Surgical History:  Procedure Laterality Date  . BACK SURGERY    . FOOT SURGERY     left foot       History reviewed. No pertinent family history.  Social History   Tobacco Use  . Smoking status: Never Smoker  . Smokeless tobacco: Never Used  Substance Use Topics  . Alcohol use: No  . Drug use: No    Home Medications Prior to Admission medications   Medication Sig Start Date End Date Taking? Authorizing Provider  albuterol (PROVENTIL HFA;VENTOLIN HFA) 108 (90 Base) MCG/ACT inhaler Inhale 1-2 puffs into the lungs every 6 (six) hours as needed for wheezing or shortness of breath.   Yes [provider]  cyclobenzaprine (FLEXERIL) 10 MG tablet Take 10 mg by mouth 3 (three) times daily as needed for muscle spasms.   Yes [provider]  gabapentin (NEURONTIN) 300 MG capsule Take 300 mg by mouth in the morning and at bedtime.   Yes [provider]  meloxicam (MOBIC) 7.5 MG tablet Take 7.5 mg by mouth in the morning and at bedtime.   Yes [provider]  Multiple Vitamin (QUINTABS) TABS Take 1 tablet by mouth daily.   Yes [provider]  omeprazole (PRILOSEC) 40 MG capsule Take 40 mg by mouth daily.   Yes [provider]  PARoxetine (PAXIL) 40 MG tablet Take 60 mg by mouth daily.   Yes [provider]  QUEtiapine (SEROQUEL) 300 MG tablet Take 300 mg by mouth at bedtime.   Yes [provider]  tadalafil (CIALIS) 20 MG tablet Take 20 mg by mouth as needed for erectile dysfunction.   Yes [provider]  tamsulosin (FLOMAX) 0.4 MG CAPS capsule Take 0.4 mg by mouth daily.   Yes [provider]  hydrOXYzine (ATARAX/VISTARIL) 25 MG tablet Take 1 tablet (25 mg total) by mouth every 8 (eight) hours as needed for itching. Patient not taking: Reported on 07/25/2019 08/15/16   Ward, Ozella Almond, PA-C  metoCLOPramide (REGLAN) 10 MG tablet Take 1 tablet (10 mg total) by mouth 3 (three) times daily with meals for 20 days. 10/12/18 11/01/18  Benay Pike, MD  permethrin (ELIMITE) 5 % cream Apply to affected area once Patient not taking: Reported on 06/19/2018 08/15/16   Ward, Ozella Almond, PA-C  Allergies    Penicillin g and Sildenafil  Review of Systems   Review of Systems  All other systems reviewed and are negative.   Physical Exam Updated Vital Signs BP (!) 144/85 (BP Location: Right Arm)   Pulse 90   Temp 98.2 F (36.8 C) (Oral)   Resp 16   Ht 1.829 m (6')   Wt 63.5 kg   SpO2 96%   BMI 18.99 kg/m   Physical Exam Vitals and nursing note reviewed.  Constitutional:      General: He is not in acute distress.    Appearance: Normal appearance. He is well-developed. He is not toxic-appearing.  HENT:     Head: Normocephalic and atraumatic.  Eyes:     General: Lids are normal.     Conjunctiva/sclera: Conjunctivae normal.     Pupils: Pupils are equal,  round, and reactive to light.  Neck:     Thyroid: No thyroid mass.     Trachea: No tracheal deviation.  Cardiovascular:     Rate and Rhythm: Normal rate and regular rhythm.     Heart sounds: Normal heart sounds. No murmur. No gallop.   Pulmonary:     Effort: Pulmonary effort is normal. No respiratory distress.     Breath sounds: Normal breath sounds. No stridor. No decreased breath sounds, wheezing, rhonchi or rales.  Abdominal:     General: Bowel sounds are normal. There is no distension.     Palpations: Abdomen is soft.     Tenderness: There is no abdominal tenderness. There is no rebound.  Musculoskeletal:        General: No tenderness. Normal range of motion.     Cervical back: Normal range of motion and neck supple.  Skin:    General: Skin is warm and dry.     Findings: No abrasion or rash.  Neurological:     Mental Status: He is alert and oriented to person, place, and time.     GCS: GCS eye subscore is 4. GCS verbal subscore is 5. GCS motor subscore is 6.     Cranial Nerves: No cranial nerve deficit.     Sensory: No sensory deficit.     Comments: Right upper extremity strength 4/5.  Remaining extremities normal.  Psychiatric:        Speech: Speech normal.        Behavior: Behavior normal.     ED Results / Procedures / Treatments   Labs (all labs ordered are listed, but only abnormal results are displayed) Labs Reviewed - No data to display  EKG None  Radiology No results found.  Procedures Procedures (including critical care time)  Medications Ordered in ED Medications  oxyCODONE-acetaminophen (PERCOCET/ROXICET) 5-325 MG per tablet 1 tablet (has no administration in time range)    ED Course  I have reviewed the triage vital signs and the nursing notes.  Pertinent labs & imaging results that were available during my care of the patient were reviewed by me and considered in my medical decision making (see chart for details).    MDM Rules/Calculators/A&P                       Patient given pain medication and feels better.  MRI from [place] that was done 2 days ago was reviewed and no acute findings.  Suspect patient's pain is chronic in nature and will prescribe medications and have him follow-up with the [facility] Final Clinical Impression(s) / ED Diagnoses Final diagnoses:  None    Rx / DC Orders ED Discharge Orders    None       Lorre Nick, MD 07/25/19 1227

## 2019-11-06 ENCOUNTER — Other Ambulatory Visit: Payer: Self-pay

## 2019-11-06 DIAGNOSIS — R413 Other amnesia: Secondary | ICD-10-CM

## 2019-11-11 ENCOUNTER — Other Ambulatory Visit: Payer: No Typology Code available for payment source

## 2019-11-18 ENCOUNTER — Other Ambulatory Visit: Payer: Self-pay

## 2019-11-18 ENCOUNTER — Ambulatory Visit (INDEPENDENT_AMBULATORY_CARE_PROVIDER_SITE_OTHER): Payer: No Typology Code available for payment source | Admitting: Neurology

## 2019-11-18 DIAGNOSIS — R413 Other amnesia: Secondary | ICD-10-CM | POA: Diagnosis not present

## 2019-11-18 NOTE — Procedures (Signed)
ELECTROENCEPHALOGRAM REPORT  Date of Study: 11/18/2019  Patient's Name: Kenneth Norton MRN: [id number] Date of Birth: 1956-02-14  Referring Provider: Dr. Darlina Rumpf  Clinical History: This is a 64 year old man with episodes of memory loss and confusion. Patient reports he "kind of stayed awake all night" prior to EEG.   Medications: Gabapentin Meloxicam Paroxetin Quetiapine Trazodone prn  Technical Summary: A multichannel digital 1-hour EEG recording measured by the international 10-20 system with electrodes applied with paste and impedances below 5000 ohms performed in our laboratory with EKG monitoring in an awake and drowsy patient.  Hyperventilation was not performed. Photic stimulation was performed.  The digital EEG was referentially recorded, reformatted, and digitally filtered in a variety of bipolar and referential montages for optimal display.    Description: The patient is awake and drowsy during the recording.  During maximal wakefulness, there is a symmetric, medium voltage 9-9.5 Hz posterior dominant rhythm that attenuates with eye opening.  The record is symmetric.  During drowsiness, there is an increase in theta slowing of the background. Sleep was not captured. Photic stimulation did not elicit any abnormalities.  There were no epileptiform discharges or electrographic seizures seen.    EKG lead was unremarkable.   Impression: This 1-hour awake and drowsy EEG is normal.    Clinical Correlation: A normal EEG does not exclude a clinical diagnosis of epilepsy.  If further clinical questions remain, prolonged EEG may be helpful.  Clinical correlation is advised.   Patrcia Dolly, M.D.

## 2019-12-01 IMAGING — CT CT ABDOMEN AND PELVIS WITH CONTRAST
2 of 5 series · 16 of 46 positions shown, 18 images · IV contrast (OMNIPAQUE)
Comparison: 06/19/2018

CLINICAL DATA: Lab moderate pain status post surgery

EXAM:
CT ABDOMEN AND PELVIS WITH CONTRAST
TECHNIQUE: Multidetector CT imaging of the abdomen and pelvis was performed
using the standard protocol following bolus administration of
intravenous contrast.
CONTRAST:  100mL OMNIPAQUE IOHEXOL 300 MG/ML  SOLN

[Series 2: axial st · axial · 0.63mm/px · z∈[+1020,+1350]mm · 13 of 78 slices shown, 15 images]
[im 6/78  soft-tissue]
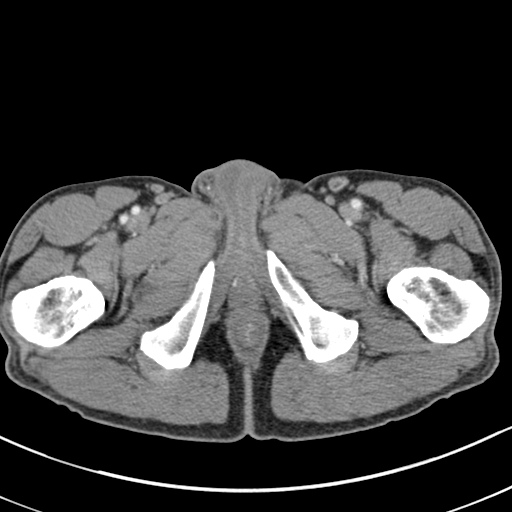
[im 6/78  bone]
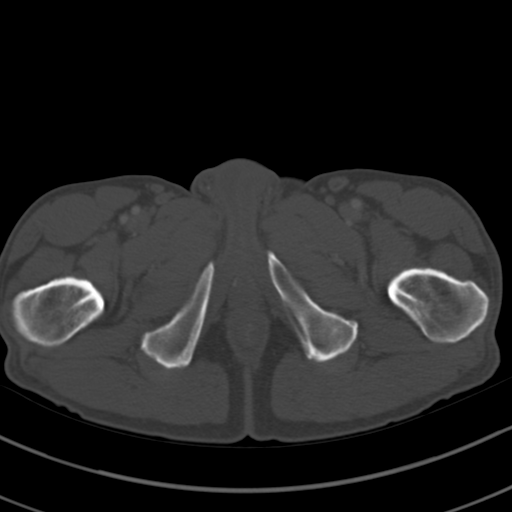
[im 11/78  soft-tissue]
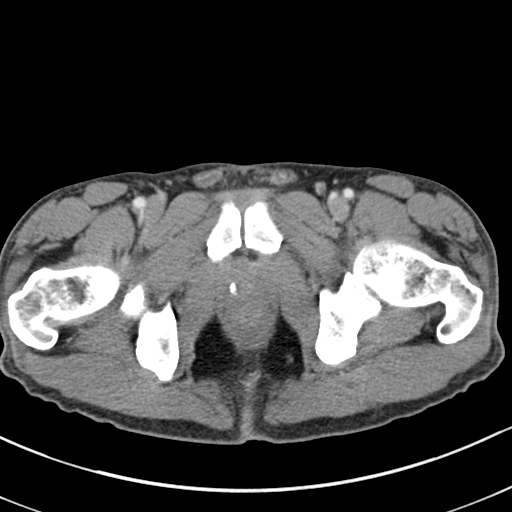
[im 16/78  soft-tissue]
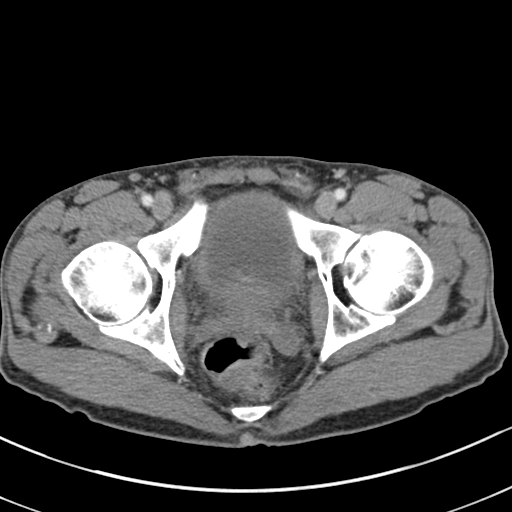
[im 21/78  soft-tissue]
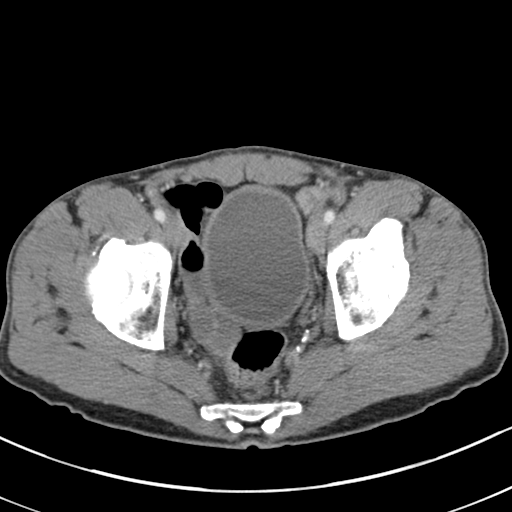
[im 26/78  soft-tissue]
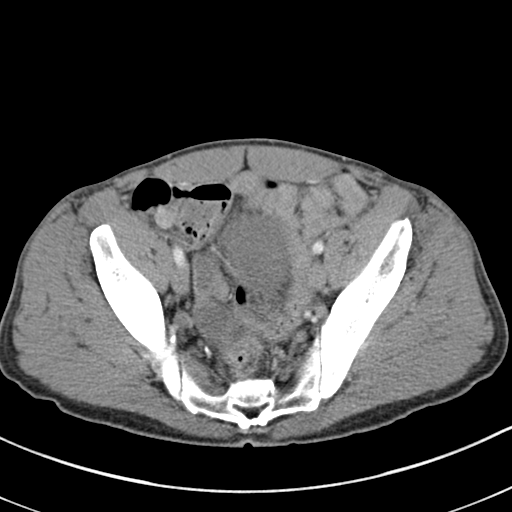
[im 31/78  soft-tissue]
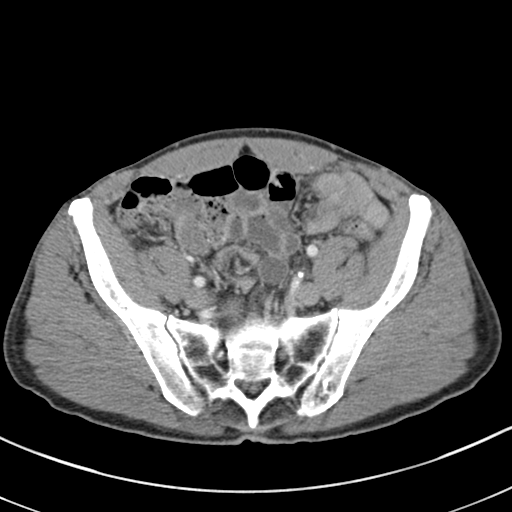
[im 42/78  soft-tissue]
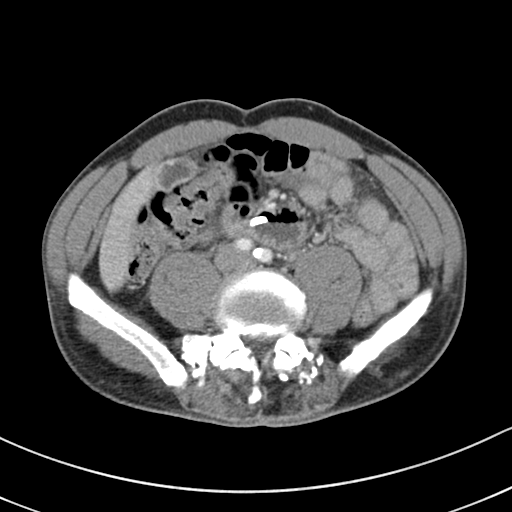
[im 47/78  soft-tissue]
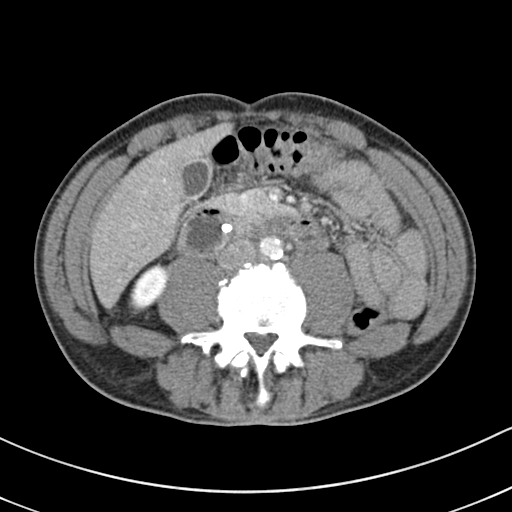
[im 52/78  soft-tissue]
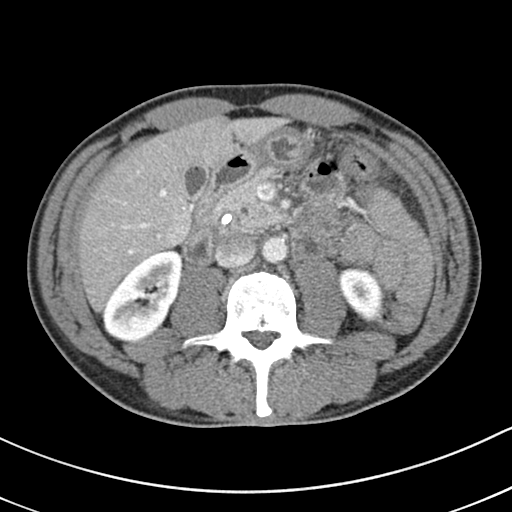
[im 52/78  bone]
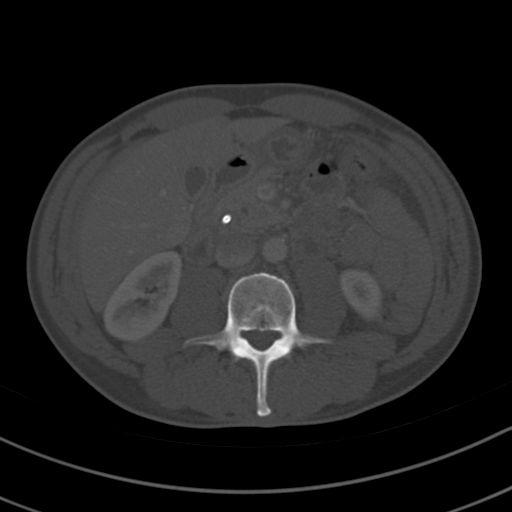
[im 57/78  soft-tissue]
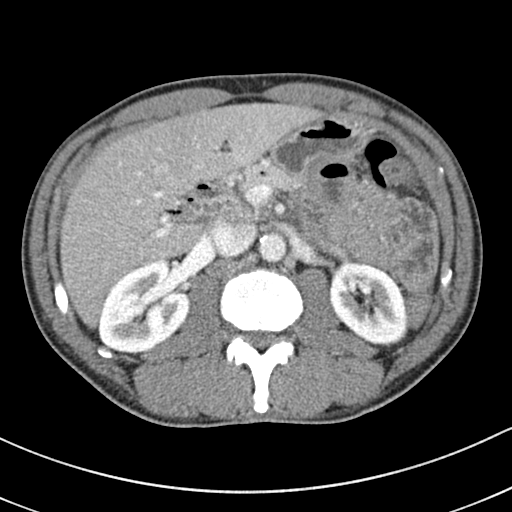
[im 62/78  soft-tissue]
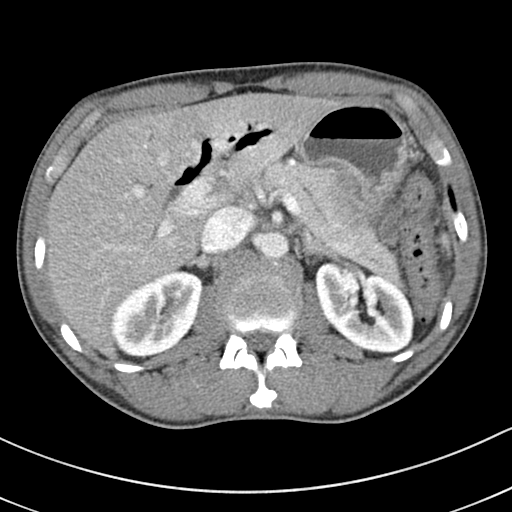
[im 67/78  soft-tissue]
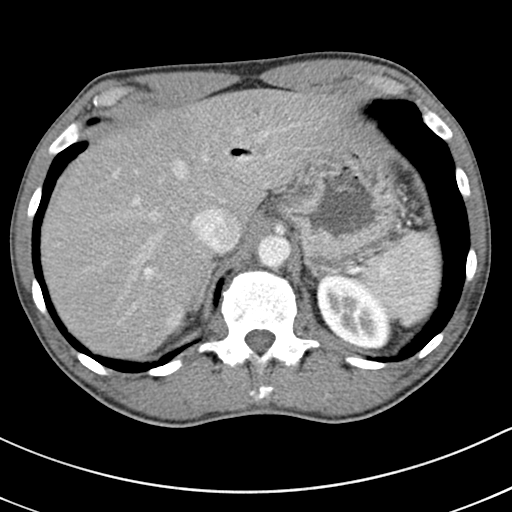
[im 72/78  soft-tissue]
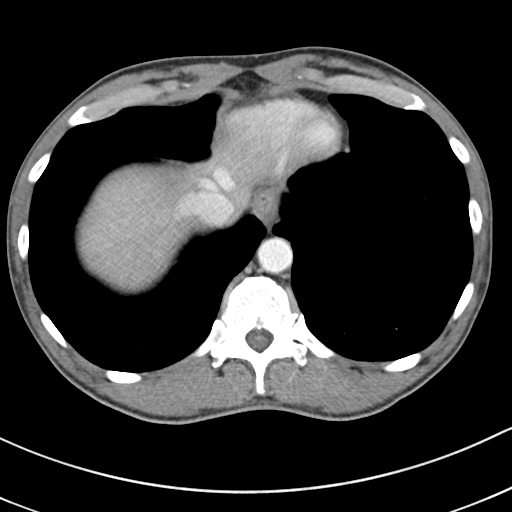

[Series 5: coronal st · coronal · 0.57mm/px · 3 of 108 slices shown]
[im 36/108  soft-tissue]
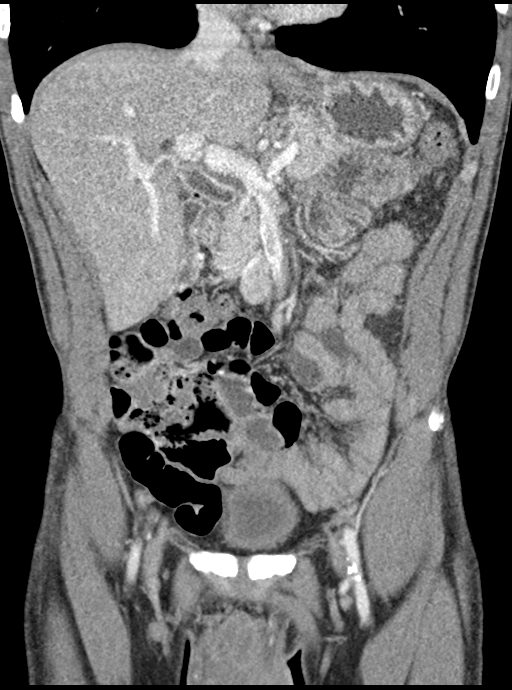
[im 48/108  soft-tissue]
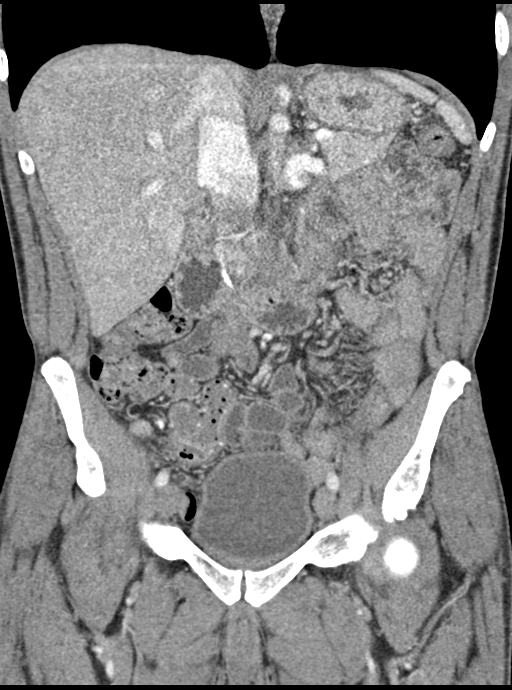
[im 60/108  soft-tissue]
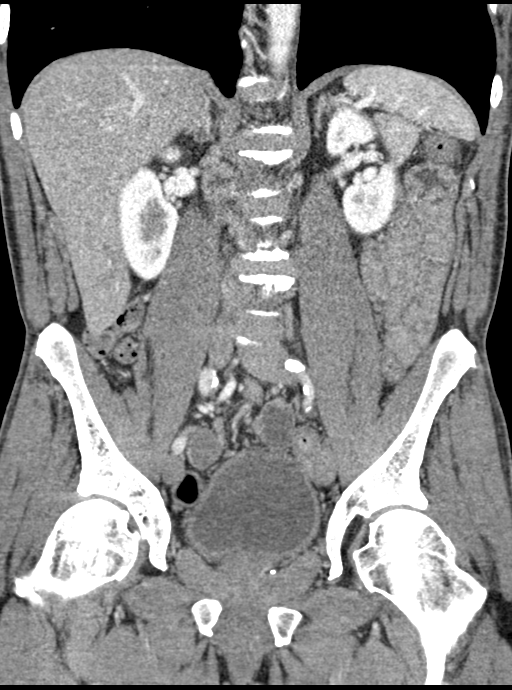

[16 of 46 positions shown; findings below may reference images not displayed]

FINDINGS: Lower chest: No acute abnormality.

Hepatobiliary: No hepatic mass. Normal gallbladder with a small
locule of air within the gallbladder. Biliary stent has migrated
distally with the proximal and in the common bile duct at the level
of the pancreatic head in the distal aspect in the third portion of
the duodenum. Pneumobilia as can be seen in the presence of a
biliary stent.

Pancreas: Unremarkable. No pancreatic ductal dilatation or
surrounding inflammatory changes.

Spleen: Normal in size without focal abnormality.

Adrenals/Urinary Tract: Adrenal glands are unremarkable. Kidneys are
normal, without renal calculi, focal lesion, or hydronephrosis.
Bladder is unremarkable.

Stomach/Bowel: Stomach is within normal limits. Appendix appears
normal. No evidence of bowel wall thickening, distention, or
inflammatory changes.

Vascular/Lymphatic: Mild abdominal aortic atherosclerosis. Normal
caliber abdominal aorta. No lymphadenopathy.

Reproductive: Prostate is unremarkable.

Other: No abdominal wall hernia or abnormality. No abdominopelvic
ascites.

Musculoskeletal: No acute osseous abnormality. No aggressive osseous
lesion. Interbody fusion at L5-S1 with posterior laminectomy.
IMPRESSION: 1. Normal gallbladder with a small locule of air within the
gallbladder. Biliary stent has migrated distally with the proximal
and in the common bile duct at the level of the pancreatic head in
the distal aspect in the third portion of the duodenum. Pneumobilia
as can be seen in the presence of a biliary stent.

## 2020-03-02 IMAGING — CT CT ABDOMEN AND PELVIS WITH CONTRAST
2 of 5 series · 16 of 46 positions shown, 18 images · IV contrast (APPLIED)
Comparison: CT abdomen pelvis dated 07/12/2018

CLINICAL DATA: 62-year-old male with acute abdominal pain.

EXAM:
CT ABDOMEN AND PELVIS WITH CONTRAST
TECHNIQUE: Multidetector CT imaging of the abdomen and pelvis was performed
using the standard protocol following bolus administration of
intravenous contrast.
CONTRAST:  100mL OMNIPAQUE IOHEXOL 300 MG/ML  SOLN

[Series 3: abdomen 5.0 · axial · 0.77mm/px · z∈[+775,+1150]mm · 13 of 89 slices shown, 15 images]
[im 7/89  soft-tissue]
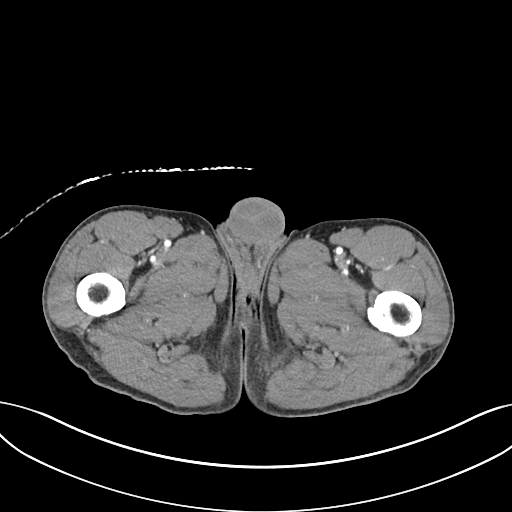
[im 7/89  bone]
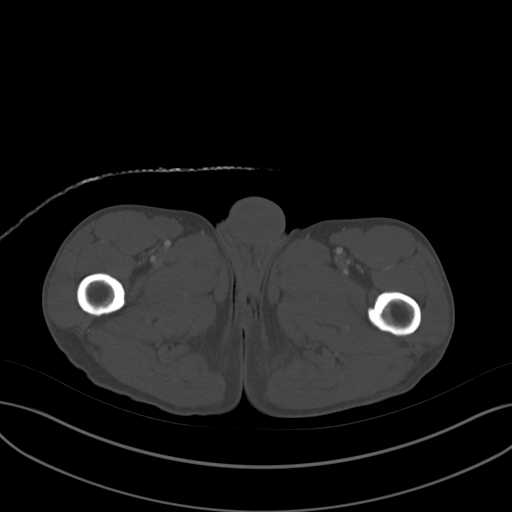
[im 13/89  soft-tissue]
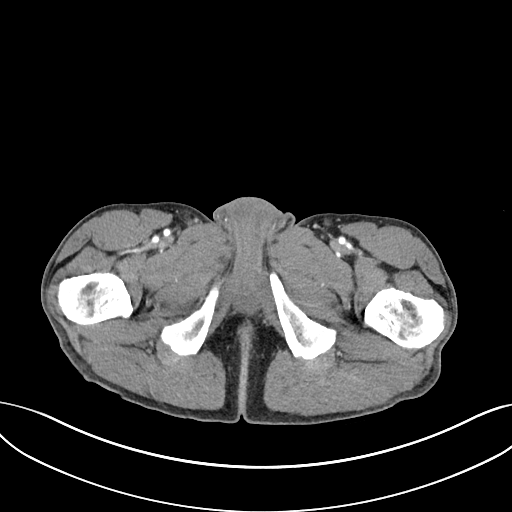
[im 19/89  soft-tissue]
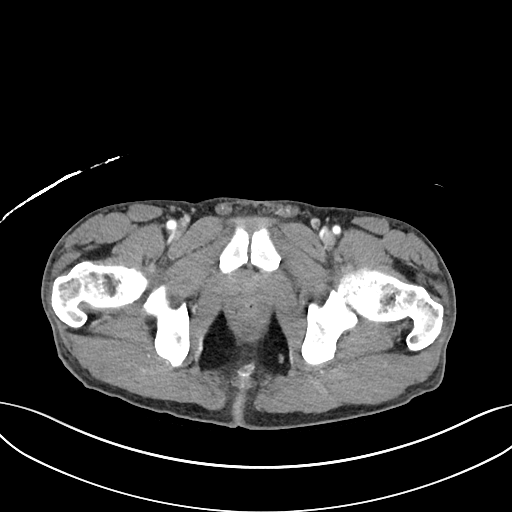
[im 26/89  soft-tissue]
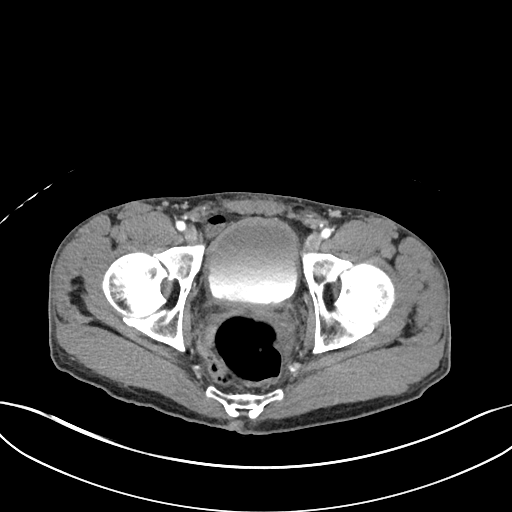
[im 32/89  soft-tissue]
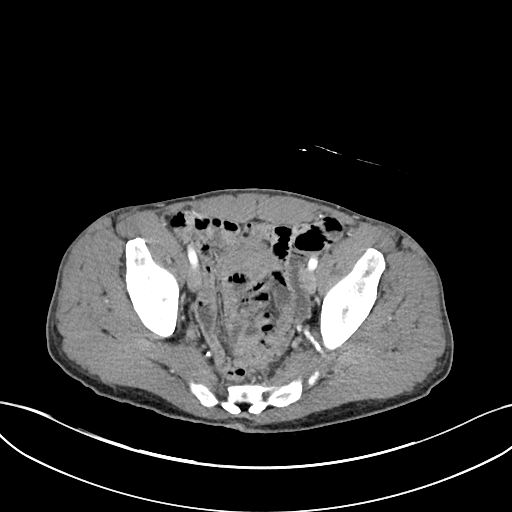
[im 38/89  soft-tissue]
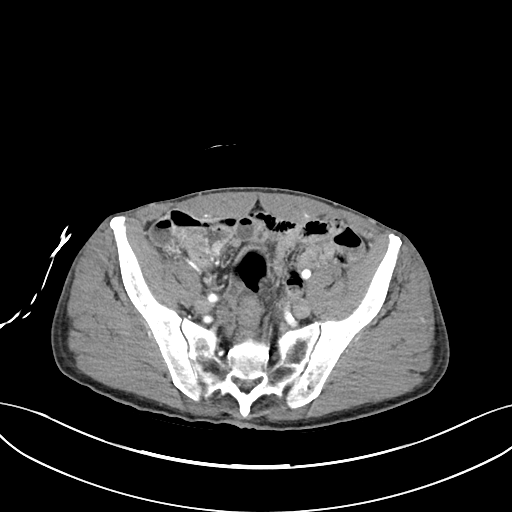
[im 45/89  soft-tissue]
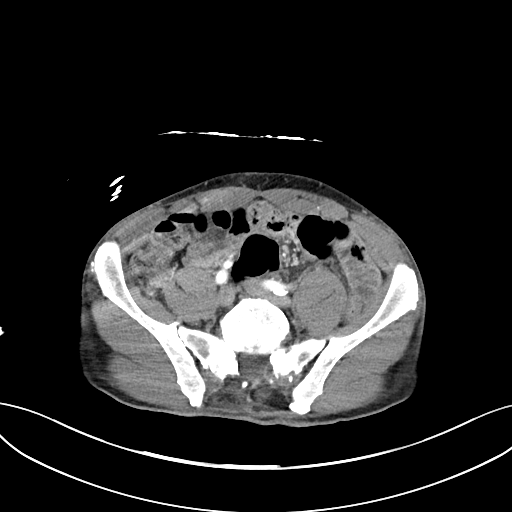
[im 51/89  soft-tissue]
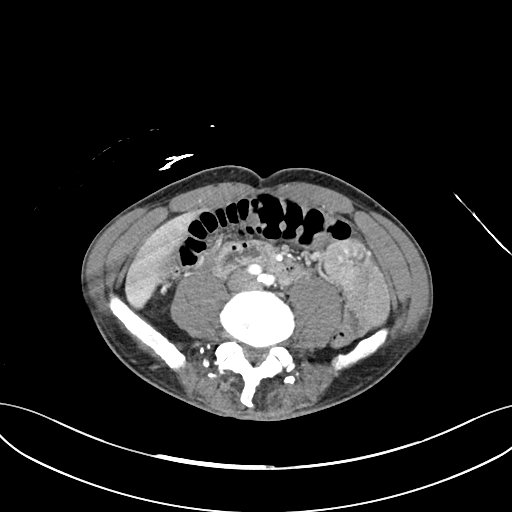
[im 57/89  soft-tissue]
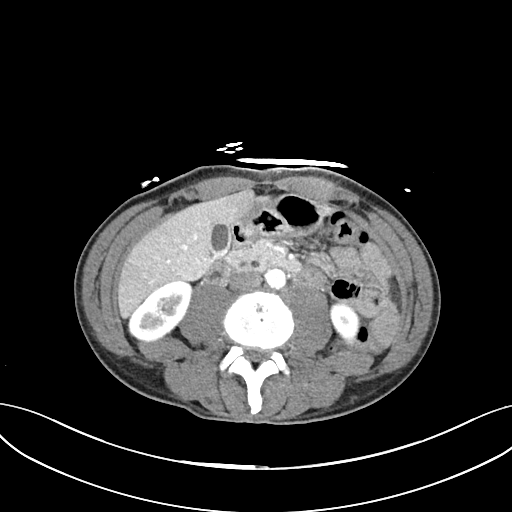
[im 57/89  bone]
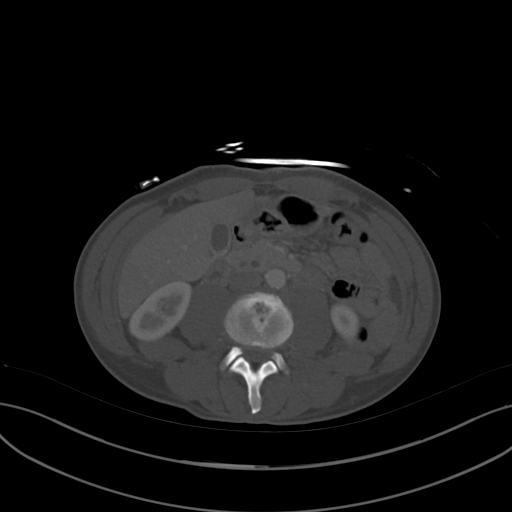
[im 63/89  soft-tissue]
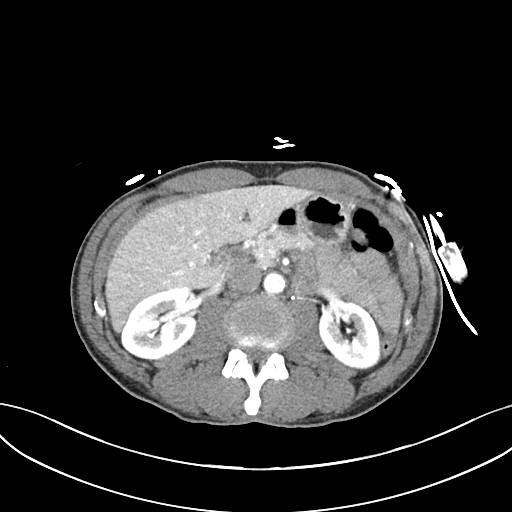
[im 70/89  soft-tissue]
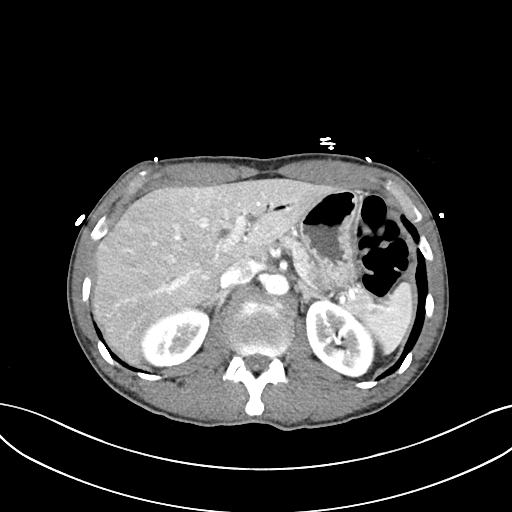
[im 76/89  soft-tissue]
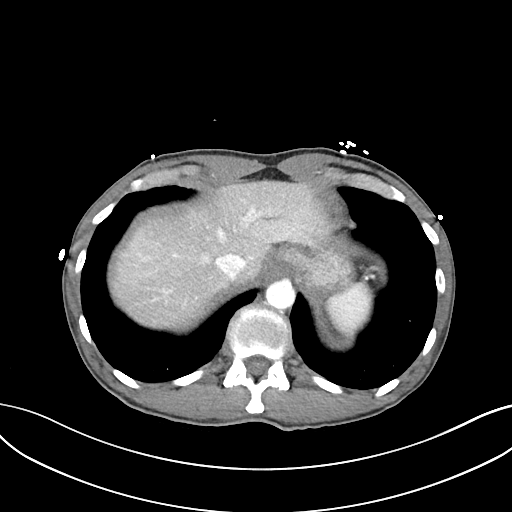
[im 82/89  soft-tissue]
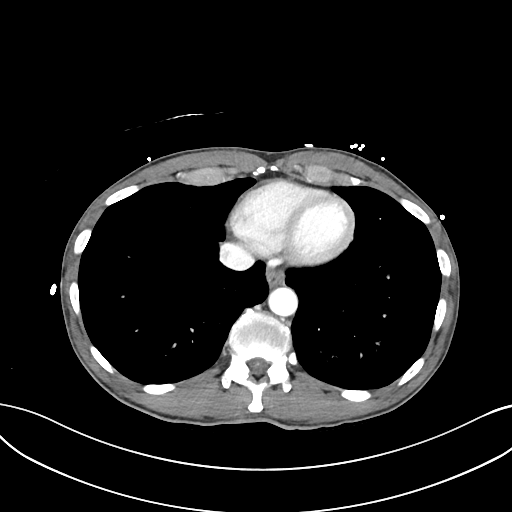

[Series 6: abdomen 3.0 mpr cor · coronal · 0.75mm/px · 3 of 82 slices shown]
[im 28/82  soft-tissue]
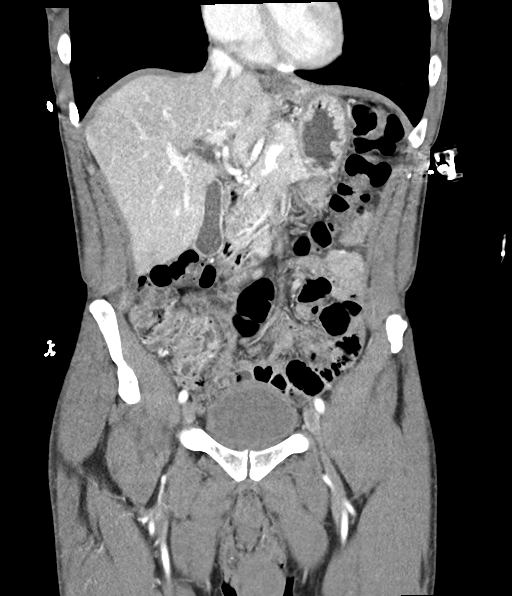
[im 37/82  soft-tissue]
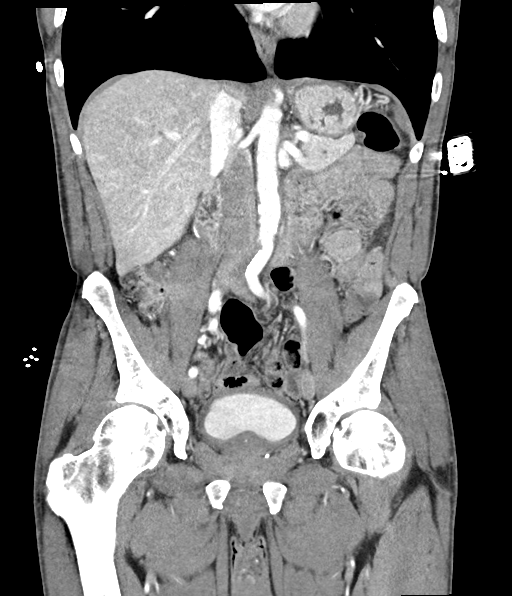
[im 46/82  soft-tissue]
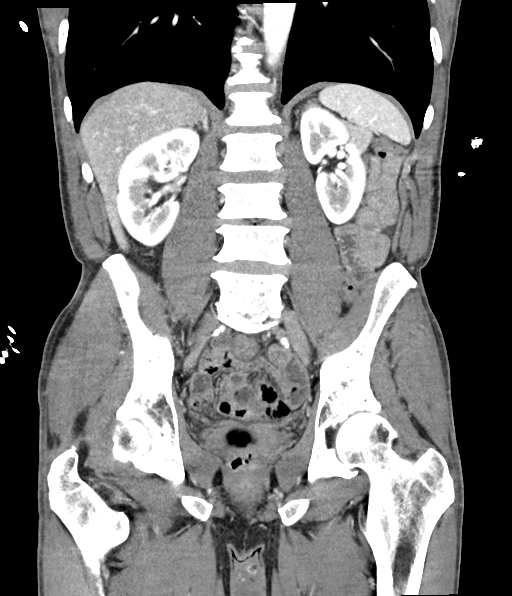

[16 of 46 positions shown; findings below may reference images not displayed]

FINDINGS: Lower chest: The visualized lung bases are clear.

No intra-abdominal free air or free fluid.

Hepatobiliary: The liver is unremarkable. There is mild intrahepatic
biliary ductal dilatation similar to prior CT. Minimal pneumobilia,
significantly decreased in size since the prior CT. There has been
interval removal of the biliary stent. No retained calcified stone
noted in the central CBD.

Pancreas: Unremarkable. No pancreatic ductal dilatation or
surrounding inflammatory changes.

Spleen: Normal in size without focal abnormality.

Adrenals/Urinary Tract: The adrenal glands are unremarkable. There
is no hydronephrosis on either side. There is symmetric enhancement
and excretion of contrast by both kidneys. The visualized ureters
appear unremarkable. The urinary bladder is only partially
distended. There is apparent diffuse thickening of the bladder wall
which may be partly related to underdistention. Cystitis is not
excluded. Correlation with urinalysis recommended.

Stomach/Bowel: Evaluation of the bowel is limited in the absence of
oral contrast and paucity of abdominal fat. There is no bowel
obstruction or active inflammation. The appendix is normal.

Vascular/Lymphatic: Mild atherosclerotic calcification of the aorta.
The IVC is unremarkable. No portal venous gas. There is no
adenopathy.

Reproductive: The prostate and seminal vesicles are grossly
unremarkable. No pelvic mass.

Other: None

Musculoskeletal: Degenerative changes of the spine. Lower lumbar
facet arthropathy and L5 laminectomy. No acute osseous pathology.
IMPRESSION: 1. No acute intra-abdominal or pelvic pathology. No bowel
obstruction or active inflammation. Normal appendix.
2. Mild intrahepatic biliary ductal dilatation similar to prior CT.
Interval removal of the biliary stent and decrease in the
pneumobilia. No retained calcified stone noted in the central CBD.
3. Minimal aortic Atherosclerosis ([id number]).
